# Patient Record
Sex: Male | Born: 1990 | Race: White | Hispanic: No | Marital: Single | Smoking: Never smoker
Health system: Southern US, Community
[De-identification: ages and names within clinical notes are randomized; demographics above are authoritative.]

## PROBLEM LIST (undated history)

## (undated) DIAGNOSIS — B2790 Infectious mononucleosis, unspecified without complication: Secondary | ICD-10-CM

---

## 2019-01-07 ENCOUNTER — Other Ambulatory Visit: Payer: Self-pay

## 2019-01-07 ENCOUNTER — Emergency Department (HOSPITAL_COMMUNITY)
Admission: EM | Admit: 2019-01-07 | Discharge: 2019-01-07 | Disposition: A | Discharge status: Home / Self Care | Payer: 59 | Attending: Emergency Medicine | Admitting: Emergency Medicine

## 2019-01-07 ENCOUNTER — Emergency Department (HOSPITAL_COMMUNITY): Payer: 59

## 2019-01-07 ENCOUNTER — Encounter (HOSPITAL_COMMUNITY): Payer: Self-pay | Admitting: Emergency Medicine

## 2019-01-07 DIAGNOSIS — S161XXA Strain of muscle, fascia and tendon at neck level, initial encounter: Secondary | ICD-10-CM | POA: Insufficient documentation

## 2019-01-07 DIAGNOSIS — W51XXXA Accidental striking against or bumped into by another person, initial encounter: Secondary | ICD-10-CM | POA: Insufficient documentation

## 2019-01-07 DIAGNOSIS — Y9389 Activity, other specified: Secondary | ICD-10-CM | POA: Diagnosis not present

## 2019-01-07 DIAGNOSIS — Y998 Other external cause status: Secondary | ICD-10-CM | POA: Insufficient documentation

## 2019-01-07 DIAGNOSIS — Y929 Unspecified place or not applicable: Secondary | ICD-10-CM | POA: Diagnosis not present

## 2019-01-07 DIAGNOSIS — S060X0A Concussion without loss of consciousness, initial encounter: Secondary | ICD-10-CM

## 2019-01-07 DIAGNOSIS — S0990XA Unspecified injury of head, initial encounter: Secondary | ICD-10-CM | POA: Insufficient documentation

## 2019-01-07 HISTORY — DX: Infectious mononucleosis, unspecified without complication: B27.90

## 2019-01-07 NOTE — ED Triage Notes (Signed)
Hit head on another person's head- did not get knocked out, felt dizzy afterwards.

## 2019-01-07 NOTE — ED Notes (Signed)
Pt verbalized understanding of discharge paperwork and follow-up care.  °

## 2019-01-07 NOTE — ED Provider Notes (Signed)
[facility]   CSN: [id number] Arrival date & time: 01/07/19  1336     History   Chief Complaint Chief Complaint  Patient presents with  . hit head    HPI Kenneth Boyer is a 28 y.o. male.  HPI  In a knocker ballRan at a guy and they hit heads Hit top of head, felt like a flash, but didn't lose consciousness and was down.  Then had headache, neck stiffness, dizziness Now having neck stiffness and feeling anxious No headache now but is having neck pain No numbness or weakness, walking ok, no change in vision, no trouble talking. Feeling fatigued No nausea or vomiting  Past Medical History:  Diagnosis Date  . Mononucleosis     There are no active problems to display for this patient.   History reviewed. No pertinent surgical history.      Home Medications    Prior to Admission medications   Not on File    Family History No family history on file.  Social History Social History   Tobacco Use  . Smoking status: Never Smoker  . Smokeless tobacco: Never Used  Substance Use Topics  . Alcohol use: Never    Frequency: Never  . Drug use: Never     Allergies   Patient has no known allergies.   Review of Systems Review of Systems  Constitutional: Negative for fever.  HENT: Negative for sore throat.   Eyes: Negative for visual disturbance.  Respiratory: Negative for shortness of breath.   Cardiovascular: Negative for chest pain.  Gastrointestinal: Negative for abdominal pain, nausea and vomiting.  Genitourinary: Negative for difficulty urinating.  Musculoskeletal: Positive for neck pain and neck stiffness. Negative for back pain.  Skin: Negative for rash.  Neurological: Positive for dizziness and headaches. Negative for seizures, speech difficulty, weakness and numbness.     Physical Exam Updated Vital Signs BP (!) 144/86   Pulse 93   Temp 98.6 F (37 C) (Oral)   Resp 16   Ht 5\' 10"  (1.778  m)   Wt 81.6 kg   SpO2 99%   BMI 25.83 kg/m   Physical Exam Vitals signs and nursing note reviewed.  Constitutional:      General: He is not in acute distress.    Appearance: He is well-developed. He is not diaphoretic.  HENT:     Head: Normocephalic and atraumatic.  Eyes:     General: No visual field deficit.    Conjunctiva/sclera: Conjunctivae normal.  Neck:     Musculoskeletal: Normal range of motion.  Cardiovascular:     Rate and Rhythm: Normal rate and regular rhythm.     Heart sounds: Normal heart sounds. No murmur. No friction rub. No gallop.   Pulmonary:     Effort: Pulmonary effort is normal. No respiratory distress.     Breath sounds: Normal breath sounds. No wheezing or rales.  Abdominal:     General: There is no distension.     Palpations: Abdomen is soft.     Tenderness: There is no abdominal tenderness. There is no guarding.  Skin:    General: Skin is warm and dry.  Neurological:     Mental Status: He is alert and oriented to person, place, and time.     GCS: GCS eye subscore is 4. GCS verbal subscore is 5. GCS motor subscore is 6.     Cranial Nerves: Cranial nerves are intact. No cranial nerve deficit, dysarthria or  facial asymmetry.     Sensory: Sensation is intact. No sensory deficit.     Motor: Motor function is intact. No weakness, tremor or pronator drift.     Coordination: Coordination is intact. Romberg sign negative.     Gait: Gait normal.      ED Treatments / Results  Labs (all labs ordered are listed, but only abnormal results are displayed) Labs Reviewed - No data to display  EKG None  Radiology Ct Head Wo Contrast  Result Date: 01/07/2019 CLINICAL DATA:  Patient was playing knocker ball and hit heads with another individual. No LOC. Patient is now experiencing some dizziness when moving. EXAM: CT HEAD WITHOUT CONTRAST CT CERVICAL SPINE WITHOUT CONTRAST TECHNIQUE: Multidetector CT imaging of the head and cervical spine was performed  following the standard protocol without intravenous contrast. Multiplanar CT image reconstructions of the cervical spine were also generated. COMPARISON:  None. FINDINGS: CT HEAD FINDINGS Brain: No evidence of acute infarction, hemorrhage, hydrocephalus, extra-axial collection or mass lesion/mass effect. Vascular: No hyperdense vessel or unexpected calcification. Skull: Normal. Negative for fracture or focal lesion. Sinuses/Orbits: Normal globes and orbits. Visualized sinuses and mastoid air cells are clear. Other: None. CT CERVICAL SPINE FINDINGS Alignment: Normal. Skull base and vertebrae: No acute fracture. No primary bone lesion or focal pathologic process. Soft tissues and spinal canal: No prevertebral fluid or swelling. No visible canal hematoma. Disc levels: Discs are well maintained in height. No disc bulging or evidence of a disc herniation. No central or neural foraminal narrowing. Upper chest: Acute findings. No masses or adenopathy. Clear lung apices. Other: None. IMPRESSION: HEAD CT 1. Normal.  No skull fracture. CERVICAL CT 1. Normal. Electronically Signed   By: Amie Portlandavid  Ormond M.D.   On: 01/07/2019 15:31   Ct Cervical Spine Wo Contrast  Result Date: 01/07/2019 CLINICAL DATA:  Patient was playing knocker ball and hit heads with another individual. No LOC. Patient is now experiencing some dizziness when moving. EXAM: CT HEAD WITHOUT CONTRAST CT CERVICAL SPINE WITHOUT CONTRAST TECHNIQUE: Multidetector CT imaging of the head and cervical spine was performed following the standard protocol without intravenous contrast. Multiplanar CT image reconstructions of the cervical spine were also generated. COMPARISON:  None. FINDINGS: CT HEAD FINDINGS Brain: No evidence of acute infarction, hemorrhage, hydrocephalus, extra-axial collection or mass lesion/mass effect. Vascular: No hyperdense vessel or unexpected calcification. Skull: Normal. Negative for fracture or focal lesion. Sinuses/Orbits: Normal globes and  orbits. Visualized sinuses and mastoid air cells are clear. Other: None. CT CERVICAL SPINE FINDINGS Alignment: Normal. Skull base and vertebrae: No acute fracture. No primary bone lesion or focal pathologic process. Soft tissues and spinal canal: No prevertebral fluid or swelling. No visible canal hematoma. Disc levels: Discs are well maintained in height. No disc bulging or evidence of a disc herniation. No central or neural foraminal narrowing. Upper chest: Acute findings. No masses or adenopathy. Clear lung apices. Other: None. IMPRESSION: HEAD CT 1. Normal.  No skull fracture. CERVICAL CT 1. Normal. Electronically Signed   By: Amie Portlandavid  Ormond M.D.   On: 01/07/2019 15:31    Procedures Procedures (including critical care time)  Medications Ordered in ED Medications - No data to display   Initial Impression / Assessment and Plan / ED Course  I have reviewed the triage vital signs and the nursing notes.  Pertinent labs & imaging results that were available during my care of the patient were reviewed by me and considered in my medical decision making (see chart for details).  28 year old male with no significant medical history presents with concern for head injury and neck pain after he struck heads with another player when he was playing/in a knockerball.  Given mechanism of injury, dizziness, CT had cervical spine done which show no acute abnormalities.  Have low suspicion for vertebral dissection given normal neurologic exam, including normal gait, normal coordination.  Suspect dizziness is likely secondary to concussion.  Discussed concussion precautions. Patient discharged in stable condition with understanding of reasons to return.   Final Clinical Impressions(s) / ED Diagnoses   Final diagnoses:  Concussion without loss of consciousness, initial encounter  Injury of head, initial encounter  Strain of neck muscle, initial encounter    ED Discharge Orders    None         Alvira Monday, MD 01/07/19 [phone]

## 2019-01-07 NOTE — ED Notes (Signed)
Patient transported to CT 

## 2019-01-07 NOTE — ED Notes (Signed)
c collar placed

## 2019-12-09 IMAGING — CT CT CERVICAL SPINE W/O CM
4 of 7 series · 13 of 33 positions shown, 14 images · non-contrast
Comparison: None.

CLINICAL DATA: Patient was playing knocker ball and hit heads with
another individual. No LOC. Patient is now experiencing some
dizziness when moving.

EXAM:
CT HEAD WITHOUT CONTRAST
CT CERVICAL SPINE WITHOUT CONTRAST
TECHNIQUE: Multidetector CT imaging of the head and cervical spine was
performed following the standard protocol without intravenous
contrast. Multiplanar CT image reconstructions of the cervical spine
were also generated.

[Series 8: c_spine 2.0 st · axial · 0.31mm/px · z∈[-228,-120]mm · 4 of 90 slices shown, 5 images]
[im 18/90  soft-tissue]
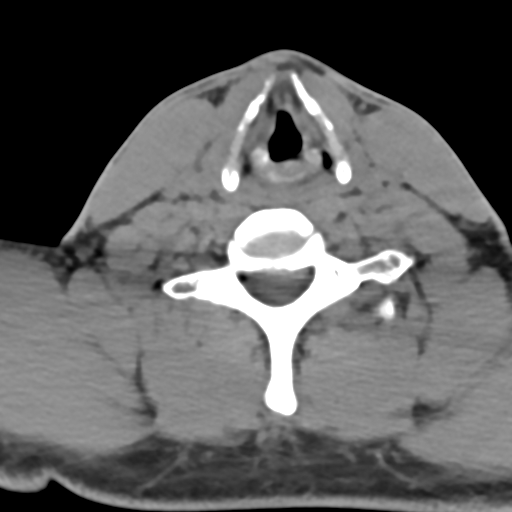
[im 18/90  bone]
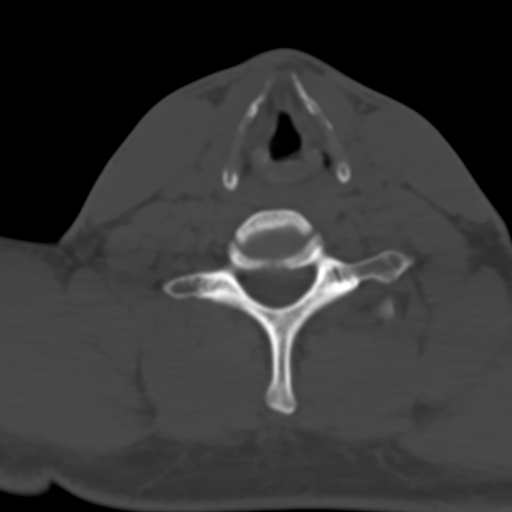
[im 36/90  bone]
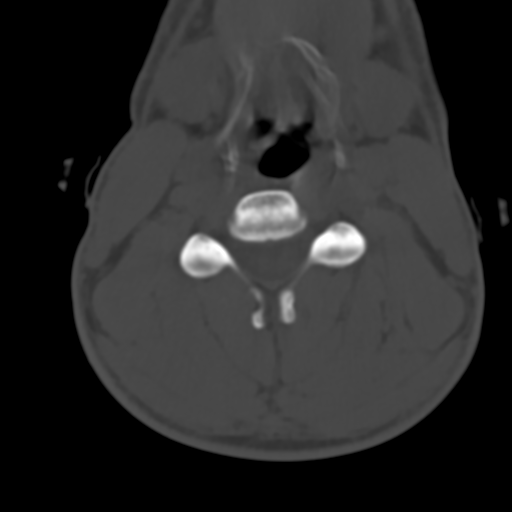
[im 54/90  bone]
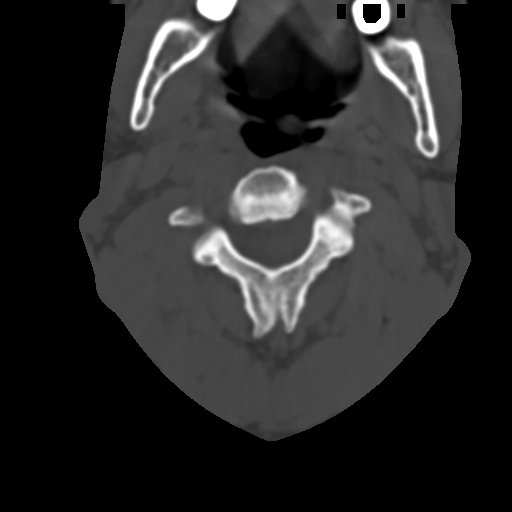
[im 72/90  bone]
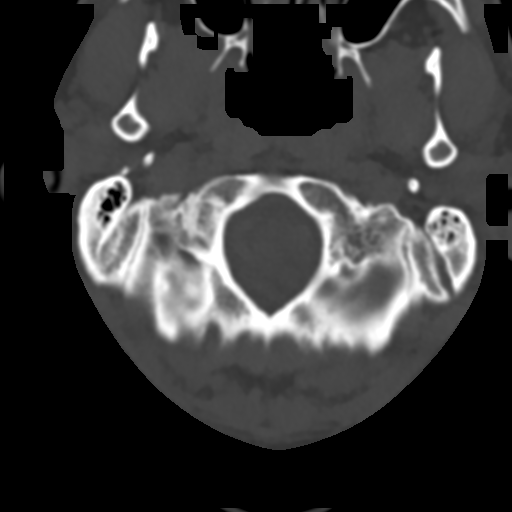

[Series 9: coronal bone · coronal · 0.23mm/px · 1 of 67 slices shown]
[im 34/67  bone]
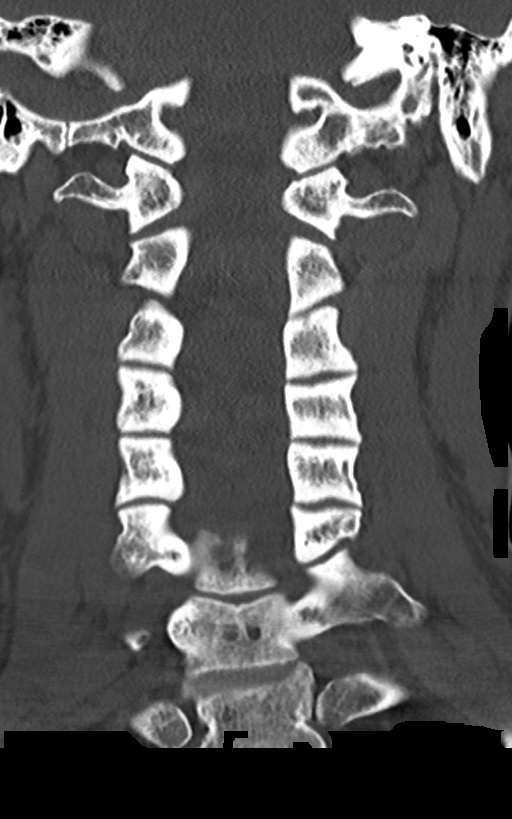

[Series 10: sagittal bone · sagittal · 0.26mm/px · 4 of 61 slices shown]
[im 13/61  bone]
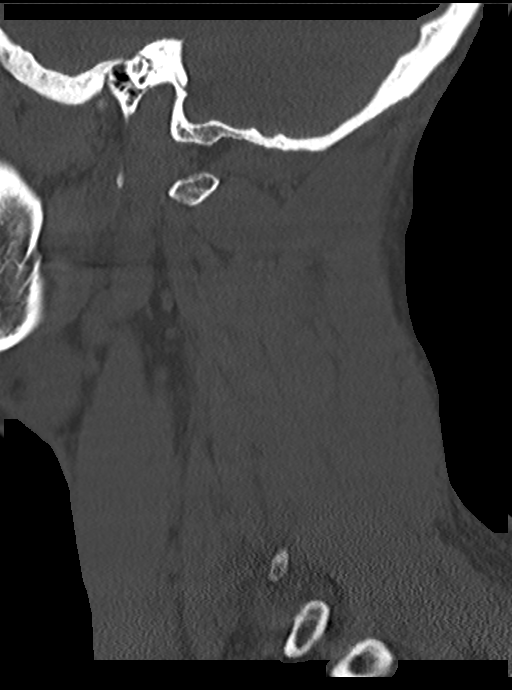
[im 25/61  bone]
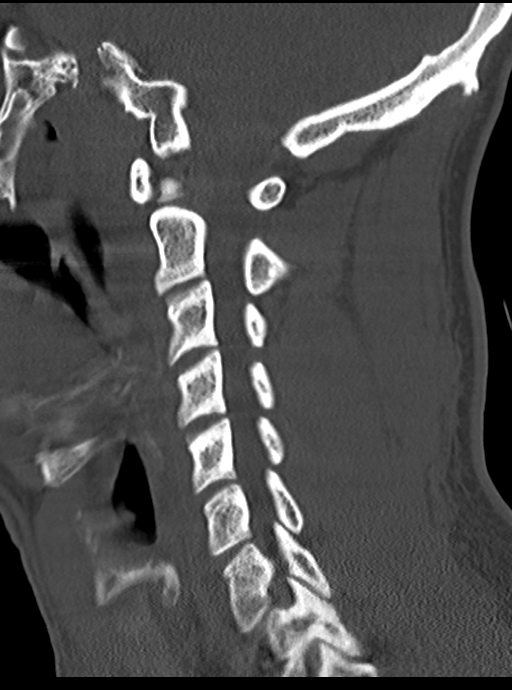
[im 37/61  bone]
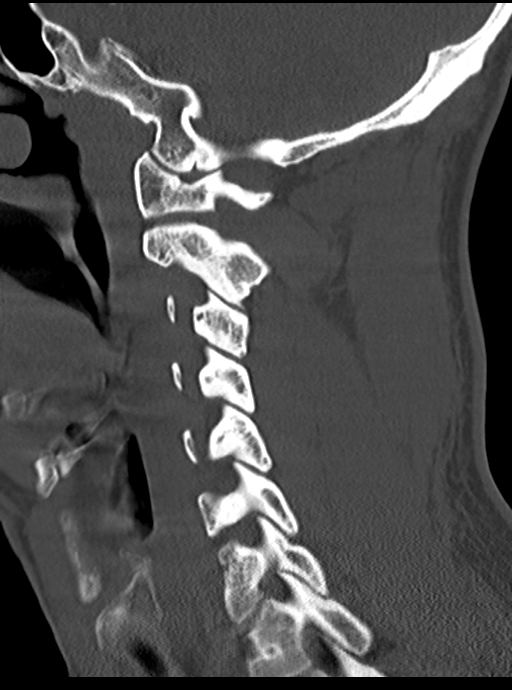
[im 49/61  bone]
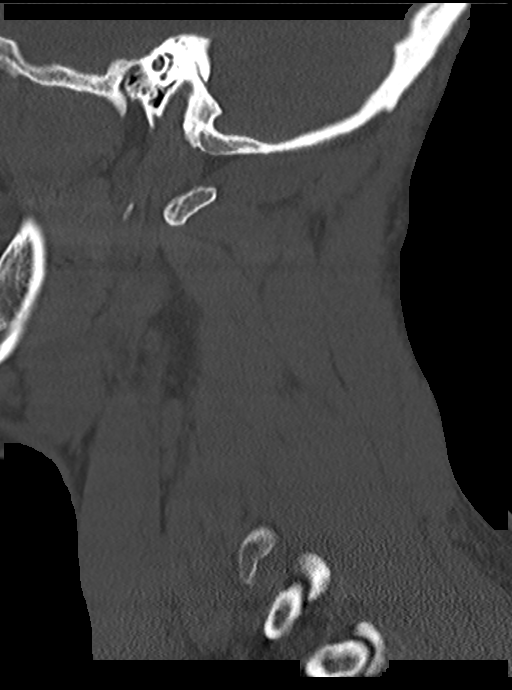

[Series 13: orthogonal axial st · axial · 0.21mm/px · z∈[-247,-145]mm · 4 of 82 slices shown]
[im 17/82  bone]
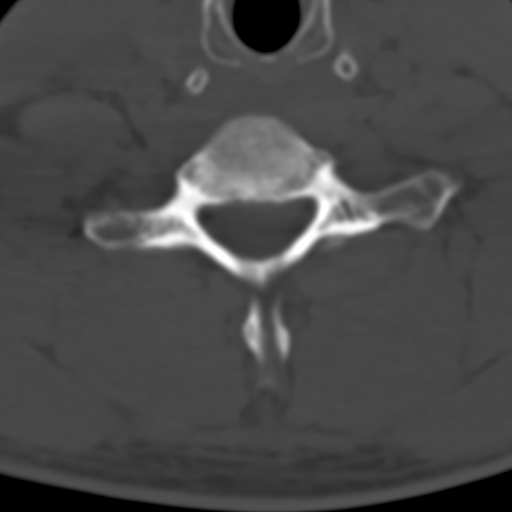
[im 33/82  bone]
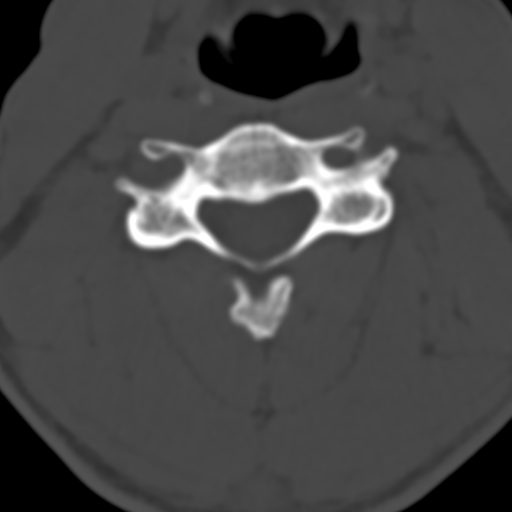
[im 49/82  bone]
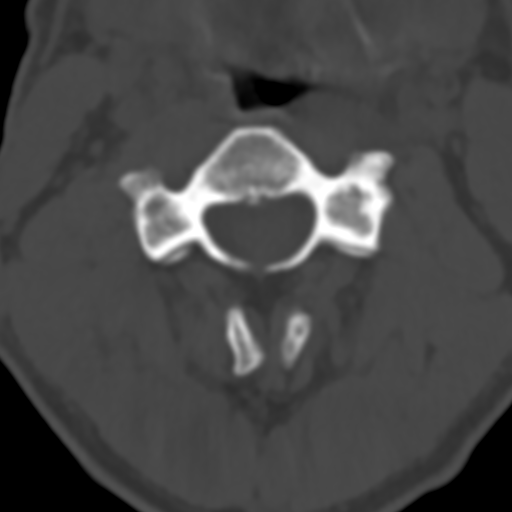
[im 65/82  bone]
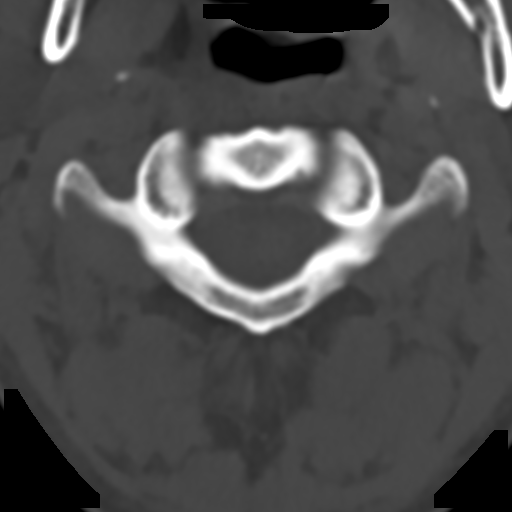

[13 of 33 positions shown; findings below may reference images not displayed]

FINDINGS: CT HEAD FINDINGS

Brain: No evidence of acute infarction, hemorrhage, hydrocephalus,
extra-axial collection or mass lesion/mass effect.

Vascular: No hyperdense vessel or unexpected calcification.

Skull: Normal. Negative for fracture or focal lesion.

Sinuses/Orbits: Normal globes and orbits. Visualized sinuses and
mastoid air cells are clear.

Other: None.

CT CERVICAL SPINE FINDINGS

Alignment: Normal.

Skull base and vertebrae: No acute fracture. No primary bone lesion
or focal pathologic process.

Soft tissues and spinal canal: No prevertebral fluid or swelling. No
visible canal hematoma.

Disc levels: Discs are well maintained in height. No disc bulging or
evidence of a disc herniation. No central or neural foraminal
narrowing.

Upper chest: Acute findings. No masses or adenopathy. Clear lung
apices.

Other: None.
IMPRESSION: HEAD CT

1. Normal.  No skull fracture.

CERVICAL CT

1. Normal.
